# Patient Record
Sex: Female | Born: 1960 | Race: White | Hispanic: No | Marital: Married | Smoking: Never smoker
Health system: Southern US, Community
[De-identification: ages and names within clinical notes are randomized; demographics above are authoritative.]

## PROBLEM LIST (undated history)

## (undated) DIAGNOSIS — F419 Anxiety disorder, unspecified: Secondary | ICD-10-CM

## (undated) DIAGNOSIS — E079 Disorder of thyroid, unspecified: Secondary | ICD-10-CM

## (undated) DIAGNOSIS — K219 Gastro-esophageal reflux disease without esophagitis: Secondary | ICD-10-CM

## (undated) HISTORY — PX: ABLATION: SHX5711

---

## 2015-06-05 ENCOUNTER — Encounter (HOSPITAL_BASED_OUTPATIENT_CLINIC_OR_DEPARTMENT_OTHER): Payer: Self-pay | Admitting: Emergency Medicine

## 2015-06-05 ENCOUNTER — Emergency Department (HOSPITAL_BASED_OUTPATIENT_CLINIC_OR_DEPARTMENT_OTHER)
Admission: EM | Admit: 2015-06-05 | Discharge: 2015-06-05 | Disposition: A | Discharge status: Home / Self Care | Payer: 59 | Attending: Emergency Medicine | Admitting: Emergency Medicine

## 2015-06-05 DIAGNOSIS — K219 Gastro-esophageal reflux disease without esophagitis: Secondary | ICD-10-CM | POA: Insufficient documentation

## 2015-06-05 DIAGNOSIS — S61219A Laceration without foreign body of unspecified finger without damage to nail, initial encounter: Secondary | ICD-10-CM

## 2015-06-05 DIAGNOSIS — Z79899 Other long term (current) drug therapy: Secondary | ICD-10-CM | POA: Insufficient documentation

## 2015-06-05 DIAGNOSIS — S61210A Laceration without foreign body of right index finger without damage to nail, initial encounter: Secondary | ICD-10-CM | POA: Diagnosis not present

## 2015-06-05 DIAGNOSIS — Z23 Encounter for immunization: Secondary | ICD-10-CM | POA: Diagnosis not present

## 2015-06-05 DIAGNOSIS — Y9289 Other specified places as the place of occurrence of the external cause: Secondary | ICD-10-CM | POA: Insufficient documentation

## 2015-06-05 DIAGNOSIS — Y9389 Activity, other specified: Secondary | ICD-10-CM | POA: Insufficient documentation

## 2015-06-05 DIAGNOSIS — F419 Anxiety disorder, unspecified: Secondary | ICD-10-CM | POA: Insufficient documentation

## 2015-06-05 DIAGNOSIS — E079 Disorder of thyroid, unspecified: Secondary | ICD-10-CM | POA: Insufficient documentation

## 2015-06-05 DIAGNOSIS — Y998 Other external cause status: Secondary | ICD-10-CM | POA: Insufficient documentation

## 2015-06-05 DIAGNOSIS — W271XXA Contact with garden tool, initial encounter: Secondary | ICD-10-CM | POA: Diagnosis not present

## 2015-06-05 HISTORY — DX: Gastro-esophageal reflux disease without esophagitis: K21.9

## 2015-06-05 HISTORY — DX: Anxiety disorder, unspecified: F41.9

## 2015-06-05 HISTORY — DX: Disorder of thyroid, unspecified: E07.9

## 2015-06-05 MED ORDER — LIDOCAINE-EPINEPHRINE 2 %-1:100000 IJ SOLN
20.0000 mL | Freq: Once | INTRAMUSCULAR | Status: AC
  Administered 2015-06-05: 20 mL via INTRADERMAL
  Filled 2015-06-05: qty 1

## 2015-06-05 MED ORDER — TETANUS-DIPHTH-ACELL PERTUSSIS 5-2.5-18.5 LF-MCG/0.5 IM SUSP
0.5000 mL | Freq: Once | INTRAMUSCULAR | Status: AC
  Administered 2015-06-05: 0.5 mL via INTRAMUSCULAR
  Filled 2015-06-05: qty 0.5

## 2015-06-05 NOTE — ED Provider Notes (Signed)
CSN: [id number]     Arrival date & time 06/05/15  1348 History   First MD Initiated Contact with Patient 06/05/15 1452     Chief Complaint  Patient presents with  . Laceration     (Consider location/radiation/quality/duration/timing/severity/associated sxs/prior Treatment) Patient is a 54 y.o. female presenting with skin laceration.  Laceration    54 year old right handed female with a laceration to her left right digit on the palmar surface at the PIP joint and between the PIP and the DIP joint with a yard tool.  She rinsed it under running water.  No numbness or difficulty moving it.  She last had tetanus 8 years ago.  This occurred 1.5 hours pte.   Past Medical History  Diagnosis Date  . Thyroid disease   . Anxiety   . GERD (gastroesophageal reflux disease)    Past Surgical History  Procedure Laterality Date  . Cesarean section    . Ablation     History reviewed. No pertinent family history. Social History  Substance Use Topics  . Smoking status: Never Smoker   . Smokeless tobacco: None  . Alcohol Use: Yes     Comment: occ   OB History    No data available     Review of Systems  All other systems reviewed and are negative.     Allergies  Codeine  Home Medications   Prior to Admission medications   Medication Sig Start Date End Date Taking? Authorizing Provider  citalopram (CELEXA) 20 MG tablet Take 20 mg by mouth daily.   Yes Historical Provider, MD  esomeprazole (NEXIUM) 40 MG capsule Take 40 mg by mouth daily at 12 noon.   Yes Historical Provider, MD  levothyroxine (SYNTHROID, LEVOTHROID) 75 MCG tablet Take 75 mcg by mouth daily before breakfast.   Yes Historical Provider, MD   BP 191/92 mmHg  Pulse 55  Temp(Src) 98.3 F (36.8 C) (Oral)  Resp 18  Ht 5\' 2"  (1.575 m)  Wt 63.504 kg  BMI 25.60 kg/m2  SpO2 99% Physical Exam  Constitutional: She appears well-developed.  HENT:  Head: Normocephalic and atraumatic.  Musculoskeletal:        Hands: Fingertip pink cap refill <2 seconds, sensation intact  Neurological: She is alert.  Skin: Skin is warm.  Psychiatric: She has a normal mood and affect.  Nursing note and vitals reviewed.   ED Course  .Marland Kitchen.Laceration Repair Date/Time: 06/05/2015 3:27 PM Performed by: Margarita GrizzleAY, Wilho Sharpley Authorized by: Margarita GrizzleAY, Anneke Cundy Consent given by: patient Patient identity confirmed: verbally with patient Body area: upper extremity Location details: right index finger Laceration length: 2 cm Foreign bodies: no foreign bodies Tendon involvement: none Nerve involvement: none Anesthesia: local infiltration Local anesthetic: lidocaine 1% with epinephrine Anesthetic total: 2 ml Patient sedated: no Preparation: Patient was prepped and draped in the usual sterile fashion. Irrigation solution: saline Irrigation method: syringe Amount of cleaning: extensive Debridement: none Skin closure: 4-0 Prolene Number of sutures: 4 Technique: simple Approximation: loose Approximation difficulty: simple Dressing: 4x4 sterile gauze Comments: Plan dressing and splint   (including critical care time) Labs Review Labs Reviewed - No data to display  Imaging Review No results found. I have personally reviewed and evaluated these images and lab results as part of my medical decision-making.   EKG Interpretation None      MDM   Final diagnoses:  Finger laceration, initial encounter    TDap ordered.  Return precautions discussed.  Patient advised re suture removal at pmd office.  Significantly elevated  bp noted.  Will recheck and have f/u at pmd     Margarita Grizzle, MD 06/06/15 [phone]

## 2015-06-05 NOTE — Discharge Instructions (Signed)
Sutures out 7-10 days.   Laceration Care, Adult A laceration is a cut that goes through all layers of the skin. The cut also goes into the tissue that is right under the skin. Some cuts heal on their own. Others need to be closed with stitches (sutures), staples, skin adhesive strips, or wound glue. Taking care of your cut lowers your risk of infection and helps your cut to heal better. HOW TO TAKE CARE OF YOUR CUT For stitches or staples:  Keep the wound clean and dry.  If you were given a bandage (dressing), you should change it at least one time per day or as told by your doctor. You should also change it if it gets wet or dirty.  Keep the wound completely dry for the first 24 hours or as told by your doctor. After that time, you may take a shower or a bath. However, make sure that the wound is not soaked in water until after the stitches or staples have been removed.  Clean the wound one time each day or as told by your doctor:  Wash the wound with soap and water.  Rinse the wound with water until all of the soap comes off.  Pat the wound dry with a clean towel. Do not rub the wound.  After you clean the wound, put a thin layer of antibiotic ointment on it as told by your doctor. This ointment:  Helps to prevent infection.  Keeps the bandage from sticking to the wound.  Have your stitches or staples removed as told by your doctor. If your doctor used skin adhesive strips:   Keep the wound clean and dry.  If you were given a bandage, you should change it at least one time per day or as told by your doctor. You should also change it if it gets dirty or wet.  Do not get the skin adhesive strips wet. You can take a shower or a bath, but be careful to keep the wound dry.  If the wound gets wet, pat it dry with a clean towel. Do not rub the wound.  Skin adhesive strips fall off on their own. You can trim the strips as the wound heals. Do not remove any strips that are still stuck  to the wound. They will fall off after a while. If your doctor used wound glue:  Try to keep your wound dry, but you may briefly wet it in the shower or bath. Do not soak the wound in water, such as by swimming.  After you take a shower or a bath, gently pat the wound dry with a clean towel. Do not rub the wound.  Do not do any activities that will make you really sweaty until the skin glue has fallen off on its own.  Do not apply liquid, cream, or ointment medicine to your wound while the skin glue is still on.  If you were given a bandage, you should change it at least one time per day or as told by your doctor. You should also change it if it gets dirty or wet.  If a bandage is placed over the wound, do not let the tape for the bandage touch the skin glue.  Do not pick at the glue. The skin glue usually stays on for 5-10 days. Then, it falls off of the skin. General Instructions  To help prevent scarring, make sure to cover your wound with sunscreen whenever you are outside after stitches are  removed, after adhesive strips are removed, or when wound glue stays in place and the wound is healed. Make sure to wear a sunscreen of at least 30 SPF.  Take over-the-counter and prescription medicines only as told by your doctor.  If you were given antibiotic medicine or ointment, take or apply it as told by your doctor. Do not stop using the antibiotic even if your wound is getting better.  Do not scratch or pick at the wound.  Keep all follow-up visits as told by your doctor. This is important.  Check your wound every day for signs of infection. Watch for:  Redness, swelling, or pain.  Fluid, blood, or pus.  Raise (elevate) the injured area above the level of your heart while you are sitting or lying down, if possible. GET HELP IF:  You got a tetanus shot and you have any of these problems at the injection site:  Swelling.  Very bad pain.  Redness.  Bleeding.  You have a  fever.  A wound that was closed breaks open.  You notice a bad smell coming from your wound or your bandage.  You notice something coming out of the wound, such as wood or glass.  Medicine does not help your pain.  You have more redness, swelling, or pain at the site of your wound.  You have fluid, blood, or pus coming from your wound.  You notice a change in the color of your skin near your wound.  You need to change the bandage often because fluid, blood, or pus is coming from the wound.  You start to have a new rash.  You start to have numbness around the wound. GET HELP RIGHT AWAY IF:  You have very bad swelling around the wound.  Your pain suddenly gets worse and is very bad.  You notice painful lumps near the wound or on skin that is anywhere on your body.  You have a red streak going away from your wound.  The wound is on your hand or foot and you cannot move a finger or toe like you usually can.  The wound is on your hand or foot and you notice that your fingers or toes look pale or bluish.   This information is not intended to replace advice given to you by your health care provider. Make sure you discuss any questions you have with your health care provider.   Document Released: 12/06/2007 Document Revised: 11/03/2014 Document Reviewed: 06/15/2014 Elsevier Interactive Patient Education [employer]2016 [employer]

## 2015-06-05 NOTE — ED Notes (Signed)
Patient states that she cut her finger with a bush cutter

## 2018-05-14 HISTORY — PX: OTHER SURGICAL HISTORY: SHX169

## 2018-06-09 ENCOUNTER — Encounter (HOSPITAL_BASED_OUTPATIENT_CLINIC_OR_DEPARTMENT_OTHER): Payer: Self-pay

## 2018-06-09 ENCOUNTER — Other Ambulatory Visit: Payer: Self-pay

## 2018-06-09 ENCOUNTER — Emergency Department (HOSPITAL_BASED_OUTPATIENT_CLINIC_OR_DEPARTMENT_OTHER)
Admission: EM | Admit: 2018-06-09 | Discharge: 2018-06-10 | Disposition: A | Discharge status: Home / Self Care | Payer: 59 | Attending: Emergency Medicine | Admitting: Emergency Medicine

## 2018-06-09 DIAGNOSIS — H5713 Ocular pain, bilateral: Secondary | ICD-10-CM | POA: Diagnosis present

## 2018-06-09 DIAGNOSIS — F419 Anxiety disorder, unspecified: Secondary | ICD-10-CM | POA: Diagnosis not present

## 2018-06-09 DIAGNOSIS — Z7982 Long term (current) use of aspirin: Secondary | ICD-10-CM | POA: Insufficient documentation

## 2018-06-09 DIAGNOSIS — Z79899 Other long term (current) drug therapy: Secondary | ICD-10-CM | POA: Diagnosis not present

## 2018-06-09 DIAGNOSIS — H1033 Unspecified acute conjunctivitis, bilateral: Secondary | ICD-10-CM

## 2018-06-09 DIAGNOSIS — H1089 Other conjunctivitis: Secondary | ICD-10-CM | POA: Diagnosis not present

## 2018-06-09 NOTE — ED Triage Notes (Signed)
Pt has swollen eye with drainage L>R. Started last PM.

## 2018-06-10 DIAGNOSIS — H1089 Other conjunctivitis: Secondary | ICD-10-CM | POA: Diagnosis not present

## 2018-06-10 MED ORDER — FLUORESCEIN SODIUM 1 MG OP STRP
1.0000 | ORAL_STRIP | Freq: Once | OPHTHALMIC | Status: AC
  Administered 2018-06-10: 1 via OPHTHALMIC
  Filled 2018-06-10: qty 1

## 2018-06-10 MED ORDER — MOXIFLOXACIN HCL 0.5 % OP SOLN: 1.0000 [drp] | OPHTHALMIC | 0 refills | Status: AC

## 2018-06-10 MED ORDER — TETRACAINE HCL 0.5 % OP SOLN
1.0000 [drp] | Freq: Once | OPHTHALMIC | Status: AC
  Administered 2018-06-10: 1 [drp] via OPHTHALMIC
  Filled 2018-06-10: qty 4

## 2018-06-10 NOTE — Discharge Instructions (Addendum)
You were seen today with eye drainage and redness.  Your symptoms are most suggestive of bacterial conjunctivitis.  It is unclear whether you have a developing corneal ulcer.  It is very important that you take eyedrops as directed.  Call your eye doctor first thing in the morning to be evaluated immediately.  If you develop worsening pain, redness, swelling, new vision changes, you need to be reevaluated immediately.

## 2018-06-10 NOTE — ED Notes (Signed)
ED Provider at bedside. 

## 2018-06-10 NOTE — ED Provider Notes (Signed)
[facility] EMERGENCY DEPARTMENT Provider Note   CSN: [id number] Arrival date & time: 06/09/18  2328     History   Chief Complaint Chief Complaint  Patient presents with  . Eye Drainage    HPI Rachel Ware is a 57 y.o. female.  HPI  This 57 year old female who presents with bilateral eye pain and drainage.  Patient reports that she went to a party last night and wore eye make-up.  She does wear contacts.  She noted pain and swelling to the left eye during the party.  She came home and removed her contacts and washed her face.  Since that time she has developed copious drainage which is been yellow from the left eye.  She subsequently developed drainage and redness of the right eye.  She denies any vision changes.  She is not currently wearing her contacts.  She denies any fevers.  Tonight she noted some swelling of the left eye.  She does report recent upper respiratory symptoms.  She denies any allergic symptoms.  No fevers.  Past Medical History:  Diagnosis Date  . Anxiety   . GERD (gastroesophageal reflux disease)   . Thyroid disease     There are no active problems to display for this patient.   Past Surgical History:  Procedure Laterality Date  . ABLATION    . carpel tunnel Right 05/14/2018  . CESAREAN SECTION       OB History   None      Home Medications    Prior to Admission medications   Medication Sig Start Date End Date Taking? Authorizing Provider  aspirin 81 MG tablet Take 81 mg by mouth daily.   Yes [provider]  citalopram (CELEXA) 20 MG tablet Take 20 mg by mouth daily.   Yes [provider]  esomeprazole (NEXIUM) 40 MG capsule Take 40 mg by mouth daily at 12 noon.   Yes [provider]  hydrochlorothiazide (HYDRODIURIL) 25 MG tablet Take 25 mg by mouth daily.   Yes [provider]  levothyroxine (SYNTHROID, LEVOTHROID) 75 MCG tablet Take 75 mcg by mouth daily before breakfast.   Yes [provider]  losartan (COZAAR) 25 MG tablet Take 25 mg by mouth daily.   Yes [provider]  Ospemifene (OSPHENA) 60 MG TABS Take by mouth.   Yes [provider]  moxifloxacin (VIGAMOX) 0.5 % ophthalmic solution Place 1 drop into both eyes every hour while awake. 06/10/18   Maheen Cwikla, Mayer Maskerourtney F, MD    Family History No family history on file.  Social History Social History   Tobacco Use  . Smoking status: Never Smoker  . Smokeless tobacco: Never Used  Substance Use Topics  . Alcohol use: Yes    Comment: occ  . Drug use: No     Allergies   Codeine   Review of Systems Review of Systems  Constitutional: Negative for fever.  Eyes: Positive for photophobia, pain, discharge and redness. Negative for itching and visual disturbance.  All other systems reviewed and are negative.    Physical Exam Updated Vital Signs BP (!) 152/92   Pulse 68   Temp 98.9 F (37.2 C) (Oral)   Resp 18   Ht 1.575 m (5\' 2" )   Wt 68 kg   SpO2 94%   BMI 27.44 kg/m   Physical Exam  Constitutional: She is oriented to person, place, and time. She appears well-developed and well-nourished. No distress.  HENT:  Head: Normocephalic and atraumatic.  Eyes: Pupils are equal, round, and reactive to light.  Pupils 4 mm and reactive bilaterally, bilateral conjunctival injection left greater than right, significant scleral redness left eye with copious purulent drainage bilaterally, slight swelling of the left eyelid, extraocular movements intact Fluoroscein exam: No corneal uptake, no obvious corneal ulcer  Cardiovascular: Normal rate and regular rhythm.  Pulmonary/Chest: Effort normal. No respiratory distress.  Neurological: She is alert and oriented to person, place, and time.  Skin: Skin is warm and dry.  Psychiatric: She has a normal mood and affect.  Nursing note and vitals reviewed.    ED Treatments / Results  Labs (all labs ordered are listed, but only abnormal results are  displayed) Labs Reviewed  EYE CULTURE    EKG None  Radiology No results found.  Procedures Procedures (including critical care time)  Medications Ordered in ED Medications  tetracaine (PONTOCAINE) 0.5 % ophthalmic solution 1 drop (1 drop Both Eyes Given by Other 06/10/18 0012)  fluorescein ophthalmic strip 1 strip (1 strip Both Eyes Given by Other 06/10/18 0012)     Initial Impression / Assessment and Plan / ED Course  I have reviewed the triage vital signs and the nursing notes.  Pertinent labs & imaging results that were available during my care of the patient were reviewed by me and considered in my medical decision making (see chart for details).     Patient presents with acute eye redness and drainage.  She is overall nontoxic-appearing vital signs are reassuring.  Physical exam is most consistent with acute bacterial conjunctivitis bilaterally.  There is no obvious corneal ulcer on exam; however, patient is obviously at risk given that she wears contacts and the progression of symptoms has been fairly acute.  She also has had a recent upper respiratory infection but feel viral is less likely.  She does have some eyelid swelling but at this time feel that this is likely related to the conjunctivitis and does not represent septal or preseptal cellulitis.  Extraocular movements are intact.  I stressed to the patient that she needs to have a formal eye exam urgently.  She does have an eye doctor.  She needs to avoid wearing contacts and needs to throw her contacts away as well as the make-up that she wore.  I did send a culture of the drainage.  We will start her on Vigamox drops every hour to cover for possible Pseudomonas.  Advised the patient that if she has any new or worsening symptoms until she can follow-up with her eye doctor later this morning, she needs to be reevaluated immediately.  Patient stated understanding.  After history, exam, and medical workup I feel the patient has  been appropriately medically screened and is safe for discharge home. Pertinent diagnoses were discussed with the patient. Patient was given return precautions.   Final Clinical Impressions(s) / ED Diagnoses   Final diagnoses:  Acute bacterial conjunctivitis of both eyes    ED Discharge Orders         Ordered    moxifloxacin (VIGAMOX) 0.5 % ophthalmic solution  Every hour while awake     06/10/18 0045           Shon Baton, MD 06/10/18 [phone]

## 2018-06-10 NOTE — ED Notes (Signed)
Patient left at this time with all belongings. 

## 2018-06-12 LAB — EYE CULTURE

## 2018-06-13 ENCOUNTER — Telehealth: Payer: Self-pay | Admitting: *Deleted

## 2018-06-13 NOTE — Telephone Encounter (Signed)
Post ED Visit - Positive Culture Follow-up  Culture report reviewed by antimicrobial stewardship pharmacist:  []  Enzo BiNathan Batchelder, Pharm.D. []  Celedonio MiyamotoJeremy Frens, Pharm.D., BCPS AQ-ID []  Garvin FilaMike Maccia, Pharm.D., BCPS []  Georgina PillionElizabeth Martin, Pharm.D., BCPS []  Ty TyMinh Pham, [address] BoulevardPharm.D., BCPS, AAHIVP []  Estella HuskMichelle Turner, Pharm.D., BCPS, AAHIVP []  Lysle Pearlachel Rumbarger, PharmD, BCPS []  Phillips Climeshuy Dang, PharmD, BCPS []  Agapito GamesAlison Masters, PharmD, BCPS []  Verlan FriendsErin Deja, PharmD  Positive eye culture Treated with Moxifloxacin eye gtts, organism sensitive to the same and no further patient follow-up is required at this time.  Virl AxeRobertson, Arvilla Salada [facility] 06/13/2018, 12:27 PM

## 2021-01-26 ENCOUNTER — Emergency Department (HOSPITAL_BASED_OUTPATIENT_CLINIC_OR_DEPARTMENT_OTHER): Payer: 59

## 2021-01-26 ENCOUNTER — Emergency Department (HOSPITAL_BASED_OUTPATIENT_CLINIC_OR_DEPARTMENT_OTHER)
Admission: EM | Admit: 2021-01-26 | Discharge: 2021-01-26 | Disposition: A | Discharge status: Home / Self Care | Payer: 59 | Attending: Emergency Medicine | Admitting: Emergency Medicine

## 2021-01-26 ENCOUNTER — Encounter (HOSPITAL_BASED_OUTPATIENT_CLINIC_OR_DEPARTMENT_OTHER): Payer: Self-pay | Admitting: *Deleted

## 2021-01-26 ENCOUNTER — Other Ambulatory Visit: Payer: Self-pay

## 2021-01-26 DIAGNOSIS — W19XXXA Unspecified fall, initial encounter: Secondary | ICD-10-CM | POA: Insufficient documentation

## 2021-01-26 DIAGNOSIS — R600 Localized edema: Secondary | ICD-10-CM | POA: Diagnosis not present

## 2021-01-26 DIAGNOSIS — S6992XA Unspecified injury of left wrist, hand and finger(s), initial encounter: Secondary | ICD-10-CM | POA: Insufficient documentation

## 2021-01-26 DIAGNOSIS — S300XXA Contusion of lower back and pelvis, initial encounter: Secondary | ICD-10-CM | POA: Insufficient documentation

## 2021-01-26 DIAGNOSIS — T148XXA Other injury of unspecified body region, initial encounter: Secondary | ICD-10-CM

## 2021-01-26 DIAGNOSIS — Z7982 Long term (current) use of aspirin: Secondary | ICD-10-CM | POA: Diagnosis not present

## 2021-01-26 DIAGNOSIS — S3993XA Unspecified injury of pelvis, initial encounter: Secondary | ICD-10-CM | POA: Diagnosis present

## 2021-01-26 LAB — CBC
HCT: 38.5 % (ref 36.0–46.0)
Hemoglobin: 13 g/dL (ref 12.0–15.0)
MCH: 29 pg (ref 26.0–34.0)
MCHC: 33.8 g/dL (ref 30.0–36.0)
MCV: 85.7 fL (ref 80.0–100.0)
Platelets: 369 10*3/uL (ref 150–400)
RBC: 4.49 MIL/uL (ref 3.87–5.11)
RDW: 12.3 % (ref 11.5–15.5)
WBC: 8.6 10*3/uL (ref 4.0–10.5)
nRBC: 0 % (ref 0.0–0.2)

## 2021-01-26 MED ORDER — KETOROLAC TROMETHAMINE 15 MG/ML IJ SOLN
15.0000 mg | Freq: Once | INTRAMUSCULAR | Status: AC
  Administered 2021-01-26: 15 mg via INTRAMUSCULAR
  Filled 2021-01-26: qty 1

## 2021-01-26 NOTE — ED Notes (Signed)
ED Provider at bedside. 

## 2021-01-26 NOTE — Discharge Instructions (Addendum)
Continue icing and taking scheduled anti-inflammatories for the bruise to your right buttocks.  You can also make sure that you are staying sitting on a inflatable ring to help keep pressure off of this area.  Your hemoglobin is normal today.  No signs of significant blood loss.  I would make sure that you are following up with your primary care doctor as you may need further referral if the swelling does not improve.  In terms of your left pinky finger there is no evidence of a fracture.  This is likely a sprain.  Use the splint at all times to help it heal you can also follow-up with your hand doctor next week.

## 2021-01-26 NOTE — ED Provider Notes (Signed)
[facility] EMERGENCY DEPARTMENT Provider Note   CSN: [id number] Arrival date & time: 01/26/21  1300     History Chief Complaint  Patient presents with   Rachel Ware is a 60 y.o. female.  HPI 60 year old female presents the ER for evaluation of right buttocks pain after a fall 5 days ago.  Patient also complains of right fifth finger pain after she fell a dislocated her finger several days ago.  Patient reports hematoma to her right buttocks that continues to spread.  She has difficulties with sitting secondary to the pain.  Denies any loss of bowel or bladder, saddle paresthesia or urinary retention.  As she has been taking intermittent ibuprofen for the pain.  No significant relief with this.  Patient was concerned because the bruising seems to be spreading.  Patient denies any head injury.  Patient reports that she also dislocated her right fifth digit but she relocated this after the injury.  Patient reports that it continues to swell and has difficulties with full range of motion.    Past Medical History:  Diagnosis Date   Anxiety    GERD (gastroesophageal reflux disease)    Thyroid disease     There are no problems to display for this patient.   Past Surgical History:  Procedure Laterality Date   ABLATION     carpel tunnel Right 05/14/2018   CESAREAN SECTION       OB History   No obstetric history on file.     No family history on file.  Social History   Tobacco Use   Smoking status: Never   Smokeless tobacco: Never  Vaping Use   Vaping Use: Never used  Substance Use Topics   Alcohol use: Yes    Comment: occ   Drug use: No    Home Medications Prior to Admission medications   Medication Sig Start Date End Date Taking? Authorizing Provider  aspirin 81 MG tablet Take 81 mg by mouth daily.    [provider]  citalopram (CELEXA) 20 MG tablet Take 20 mg by mouth daily.    [provider]  esomeprazole (NEXIUM)  40 MG capsule Take 40 mg by mouth daily at 12 noon.    [provider]  hydrochlorothiazide (HYDRODIURIL) 25 MG tablet Take 25 mg by mouth daily.    [provider]  levothyroxine (SYNTHROID, LEVOTHROID) 75 MCG tablet Take 75 mcg by mouth daily before breakfast.    [provider]  losartan (COZAAR) 25 MG tablet Take 25 mg by mouth daily.    [provider]  moxifloxacin (VIGAMOX) 0.5 % ophthalmic solution Place 1 drop into both eyes every hour while awake. 06/10/18   Horton, Mayer Masker, MD  Ospemifene (OSPHENA) 60 MG TABS Take by mouth.    [provider]    Allergies    Codeine  Review of Systems   Review of Systems  Constitutional:  Negative for chills and fever.  HENT:  Negative for congestion.   Eyes:  Negative for discharge.  Respiratory:  Negative for cough.   Gastrointestinal:  Negative for abdominal pain and vomiting.  Genitourinary:  Negative for difficulty urinating.  Musculoskeletal:  Positive for arthralgias, joint swelling and myalgias.  Skin:  Positive for color change.  Neurological:  Negative for weakness and numbness.  Psychiatric/Behavioral:  Negative for confusion.    Physical Exam Updated Vital Signs BP (!) 161/83 (BP Location: Right Arm)   Pulse 70   Temp  98.4 F (36.9 C) (Oral)   Resp 16   Ht 5\' 2"  (1.575 m)   Wt 68 kg   SpO2 99%   BMI 27.44 kg/m   Physical Exam Vitals and nursing note reviewed.  Constitutional:      General: She is not in acute distress.    Appearance: She is well-developed. She is not ill-appearing or toxic-appearing.  HENT:     Head: Normocephalic and atraumatic.  Eyes:     General: No scleral icterus.       Right eye: No discharge.        Left eye: No discharge.  Pulmonary:     Effort: No respiratory distress.  Musculoskeletal:        General: Normal range of motion.     Cervical back: Normal range of motion.     Comments: Patient with edema over the left pinky finger.  There  is no obvious deformity.  She does have limited range of motion secondary to the swelling.  Cap refill is normal.  Sensation is intact.  Skin:    General: Skin is warm and dry.     Coloration: Skin is not pale.     Comments: Patient has large hematoma to the right buttocks with small amount of spread to the left buttocks.  The area is indurated.  There is no erythema or warmth.  Patient has no fluctuance.  Neurological:     Mental Status: She is alert.     Gait: Gait normal.  Psychiatric:        Mood and Affect: Mood normal.        Behavior: Behavior normal.        Thought Content: Thought content normal.        Judgment: Judgment normal.    ED Results / Procedures / Treatments   Labs (all labs ordered are listed, but only abnormal results are displayed) Labs Reviewed  CBC    EKG None  Radiology DG Lumbar Spine Complete  Result Date: 01/26/2021 CLINICAL DATA:  Fall.  Back pain EXAM: LUMBAR SPINE - COMPLETE 4+ VIEW COMPARISON:  None. FINDINGS: There is no evidence of lumbar spine fracture. Alignment is normal. Intervertebral disc spaces are maintained. IMPRESSION: Negative. Electronically Signed   By: 07-29-1968 M.D.   On: 01/26/2021 14:02   DG Finger Little Left  Result Date: 01/26/2021 CLINICAL DATA:  Injury fifth finger EXAM: LEFT LITTLE FINGER 2+V COMPARISON:  None. FINDINGS: There is no evidence of fracture or dislocation. There is no evidence of arthropathy or other focal bone abnormality. Soft tissues are unremarkable. IMPRESSION: Negative. Electronically Signed   By: 01/28/2021 M.D.   On: 01/26/2021 14:01    Procedures Procedures   Medications Ordered in ED Medications  ketorolac (TORADOL) 15 MG/ML injection 15 mg (15 mg Intramuscular Given 01/26/21 1436)    ED Course  I have reviewed the triage vital signs and the nursing notes.  Pertinent labs & imaging results that were available during my care of the patient were reviewed by me and considered in my  medical decision making (see chart for details).    MDM Rules/Calculators/A&P                           60 year old presents with 2 separate complaints.  She states that she had a fall 5 days ago onto her right buttocks.  Patient does have large hematoma.  She reports that it is tender  to sit and has had some spread of the ecchymosis.  On exam patient does have large hematoma appreciated.  There is no area of fluctuance, erythema or warmth to suspect secondary infection at this time.  X-ray was performed and reviewed that shows no evidence of fracture or malalignment.  Patient also had a CBC performed that shows hemoglobin is normal and at baseline there is no evidence of acute blood loss.  Patient to continue icing and elevating the area.  I also discussed use of a donut ring and continuing scheduled anti-inflammatories.  Need close outpatient follow-up.  Patient also has pain to her left pinky finger after dislocating this from a fall several days ago as well.  X-ray reviewed today shows no evidence of fracture or malalignment.  Patient placed into a splint for comfort and to improve healing.  Patient encouraged rice therapy and does have outpatient follow-up with hand orthopedics  Pt is hemodynamically stable, in NAD, & able to ambulate in the ED. Evaluation does not show pathology that would require ongoing emergent intervention or inpatient treatment. I explained the diagnosis to the patient. Pain has been managed & has no complaints prior to dc. Pt is comfortable with above plan and is stable for discharge at this time. All questions were answered prior to disposition. Strict return precautions for f/u to the ED were discussed. Encouraged follow up with PCP.  Pt seen by my attending Dr. Delford Field who agrees with the above plan.  Final Clinical Impression(s) / ED Diagnoses Final diagnoses:  Fall, initial encounter  Injury of finger of left hand, initial encounter  Hematoma    Rx / DC Orders ED  Discharge Orders     None        Wallace Keller 01/26/21 1513    Koleen Distance, MD 01/26/21 [phone]

## 2021-01-26 NOTE — ED Triage Notes (Addendum)
Fall x 5 days c.o right buttocks pain , c/o left 5th finger pain

## 2021-12-12 IMAGING — CR DG FINGER LITTLE 2+V*L*
3 series · 3 of 3 positions shown · non-contrast
Comparison: None.

CLINICAL DATA: Injury fifth finger

EXAM:
LEFT LITTLE FINGER 2+V

[x finger pa left]
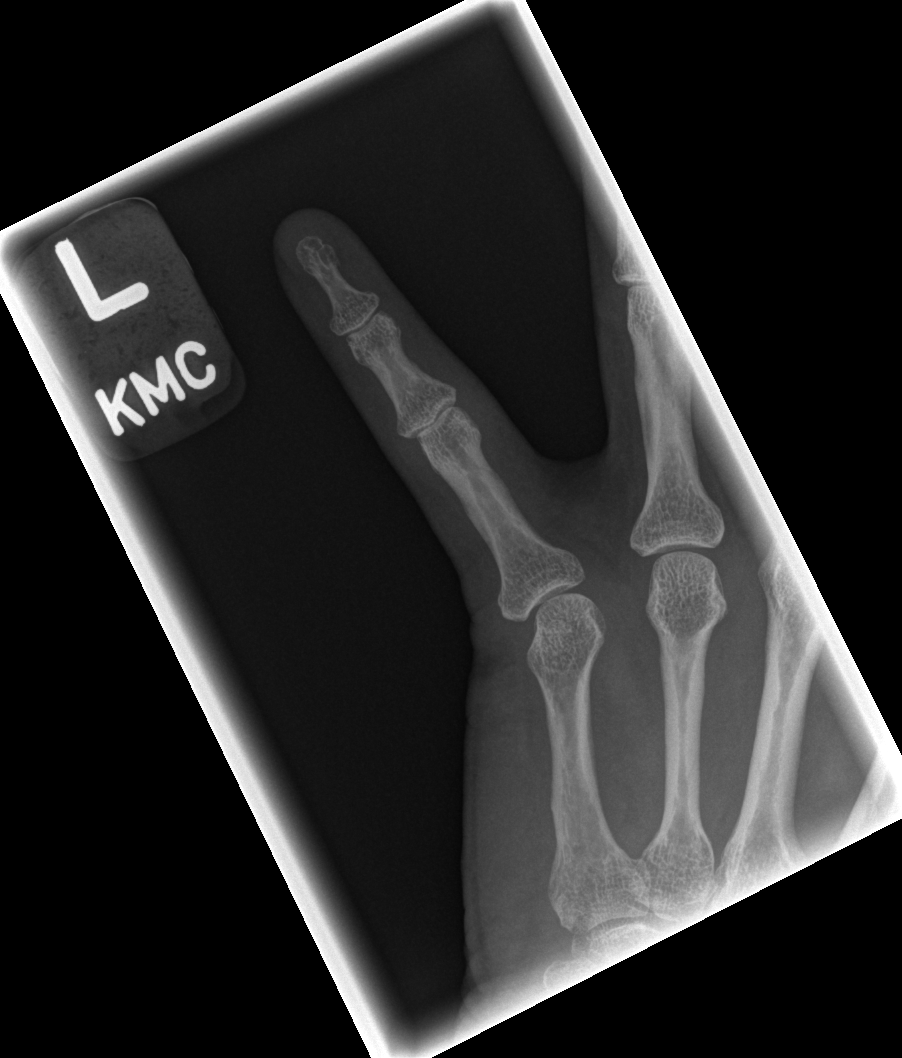

[x finger obl. left]
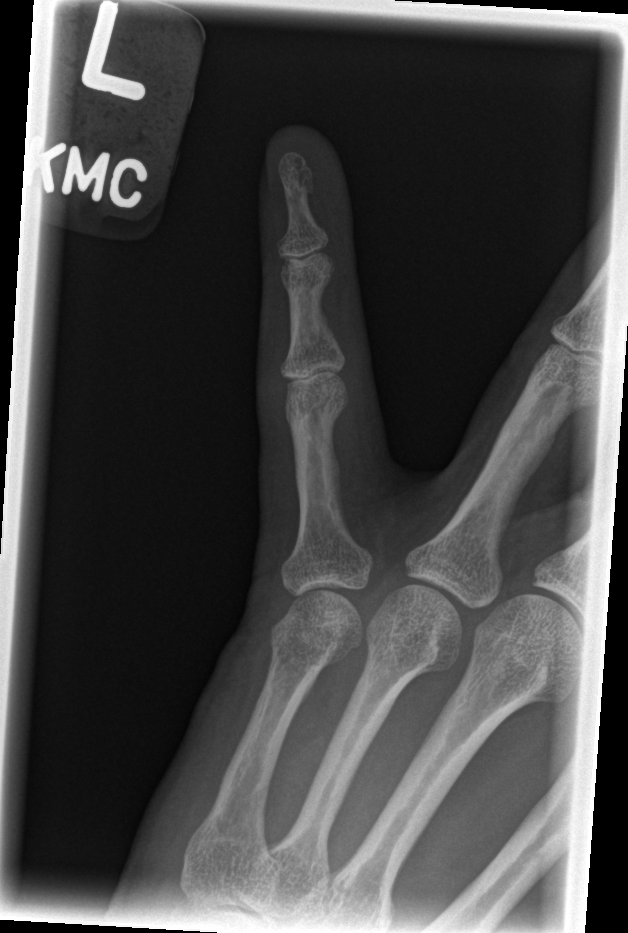

[x finger lateral left]
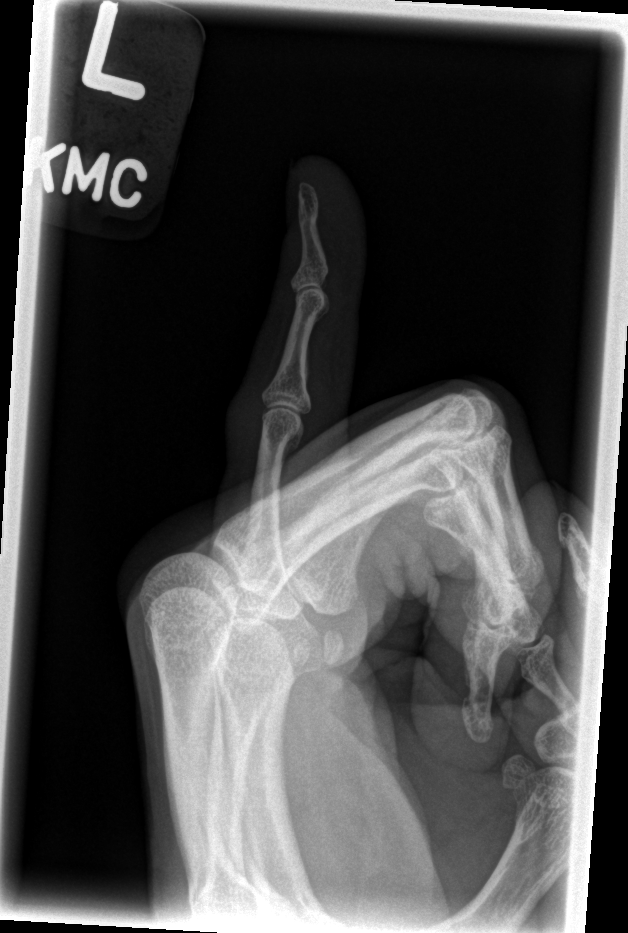

[3 of 3 positions shown; findings below may reference images not displayed]

FINDINGS: There is no evidence of fracture or dislocation. There is no
evidence of arthropathy or other focal bone abnormality. Soft
tissues are unremarkable.
IMPRESSION: Negative.

## 2022-07-13 ENCOUNTER — Encounter (HOSPITAL_BASED_OUTPATIENT_CLINIC_OR_DEPARTMENT_OTHER): Payer: Self-pay | Admitting: Pediatrics

## 2022-07-13 ENCOUNTER — Other Ambulatory Visit: Payer: Self-pay

## 2022-07-13 ENCOUNTER — Emergency Department (HOSPITAL_BASED_OUTPATIENT_CLINIC_OR_DEPARTMENT_OTHER)
Admission: EM | Admit: 2022-07-13 | Discharge: 2022-07-13 | Disposition: A | Discharge status: Home / Self Care | Attending: Emergency Medicine | Admitting: Emergency Medicine

## 2022-07-13 DIAGNOSIS — J3489 Other specified disorders of nose and nasal sinuses: Secondary | ICD-10-CM | POA: Diagnosis not present

## 2022-07-13 DIAGNOSIS — I10 Essential (primary) hypertension: Secondary | ICD-10-CM | POA: Insufficient documentation

## 2022-07-13 DIAGNOSIS — Z79899 Other long term (current) drug therapy: Secondary | ICD-10-CM | POA: Insufficient documentation

## 2022-07-13 DIAGNOSIS — J0140 Acute pansinusitis, unspecified: Secondary | ICD-10-CM | POA: Insufficient documentation

## 2022-07-13 DIAGNOSIS — Z7982 Long term (current) use of aspirin: Secondary | ICD-10-CM | POA: Insufficient documentation

## 2022-07-13 DIAGNOSIS — R059 Cough, unspecified: Secondary | ICD-10-CM | POA: Diagnosis present

## 2022-07-13 MED ORDER — PROMETHAZINE HCL 25 MG PO TABS: 25.0000 mg | ORAL_TABLET | Freq: Two times a day (BID) | ORAL | 0 refills | Status: AC | PRN

## 2022-07-13 MED ORDER — AMOXICILLIN-POT CLAVULANATE 875-125 MG PO TABS: 1.0000 | ORAL_TABLET | Freq: Two times a day (BID) | ORAL | 0 refills | Status: AC

## 2022-07-13 NOTE — Discharge Instructions (Signed)
Thank you for letting us take care of you today.  We will treat you for bacterial sinusitis with an antibiotic. As Phenergan has helped with your cough in the past, I have prescribed this to take as needed twice daily. Please stop taking the over the counter cough and cold medications due to your elevated blood pressure.  If you do not start noticing improvement in the next few days or develop chest pain, shortness of breath, frequent vomiting or diarrhea, fever greater than 103 F or uncontrolled with Tylenol or Motrin at home, or other concerns, please be re-evaluated at nearest emergency department. Please follow up with your PCP in the next week if you are not improving.

## 2022-07-13 NOTE — ED Triage Notes (Signed)
C/O URI symptoms along with losing voice, fevers at night, since 1/1, reports takign OTC meds with some relief.

## 2022-07-13 NOTE — ED Provider Notes (Signed)
[facility] EMERGENCY DEPARTMENT Provider Note   CSN: [id number] Arrival date & time: 07/13/22  1100     History  Chief Complaint  Patient presents with   Hoarse    Rachel Ware is a 62 y.o. female with PMH HTN, anxiety, GERD, thyroid disease who presents to the ED complaining of hoarseness, cough, congestion, and facial pain for the last 11 days.  Patient has been attempting to manage symptoms with over-the-counter medications without any significant relief. She suspects these medications are raising her blood pressure as it is normally well controlled. She has a history of recurrent sinusitis estimating that she has sinusitis approximately twice a year that requires antibiotics before we will clear.  Normally, she only has the facial pain and some congestion so she did not suspect this was a sinus infection as it did not present typically for her.  She has had intermittent fevers up to 101 Fahrenheit but the last was approximately 2 days ago.  She denies chest pain, shortness of breath, nausea, vomiting, abdominal pain, ear pain, sore throat, or difficulty swallowing.  No known sick contacts.  She has no history of lung disease.     Home Medications Prior to Admission medications   Medication Sig Start Date End Date Taking? Authorizing Provider  amoxicillin-clavulanate (AUGMENTIN) 875-125 MG tablet Take 1 tablet by mouth every 12 (twelve) hours for 10 days. 07/13/22 07/23/22 Yes Neera Teng L, PA-C  promethazine (PHENERGAN) 25 MG tablet Take 1 tablet (25 mg total) by mouth 2 (two) times daily as needed for up to 5 days for nausea or vomiting. 07/13/22 07/18/22 Yes Mounir Skipper L, PA-C  aspirin 81 MG tablet Take 81 mg by mouth daily.    [provider]  citalopram (CELEXA) 20 MG tablet Take 20 mg by mouth daily.    [provider]  esomeprazole (NEXIUM) 40 MG capsule Take 40 mg by mouth daily at 12 noon.    [provider]  hydrochlorothiazide  (HYDRODIURIL) 25 MG tablet Take 25 mg by mouth daily.    [provider]  levothyroxine (SYNTHROID, LEVOTHROID) 75 MCG tablet Take 75 mcg by mouth daily before breakfast.    [provider]  losartan (COZAAR) 25 MG tablet Take 25 mg by mouth daily.    [provider]  moxifloxacin (VIGAMOX) 0.5 % ophthalmic solution Place 1 drop into both eyes every hour while awake. 06/10/18   Horton, Barbette Hair, MD  Ospemifene (OSPHENA) 60 MG TABS Take by mouth.    [provider]      Allergies    Codeine    Review of Systems   Review of Systems  Constitutional:  Positive for fever. Negative for activity change, appetite change and chills.  HENT:  Positive for congestion, rhinorrhea and sinus pain. Negative for ear pain, sore throat, trouble swallowing and voice change.   Eyes:  Negative for pain and visual disturbance.  Respiratory:  Positive for cough. Negative for chest tightness, shortness of breath and wheezing.   Cardiovascular:  Negative for chest pain and palpitations.  Gastrointestinal:  Negative for abdominal pain, diarrhea, nausea and vomiting.  Genitourinary:  Negative for dysuria and hematuria.  Musculoskeletal:  Negative for arthralgias, back pain, myalgias, neck pain and neck stiffness.  Skin:  Negative for color change and rash.  Neurological:  Negative for dizziness, seizures, syncope, weakness and light-headedness.  Psychiatric/Behavioral:  Negative for confusion.   All other systems reviewed and are negative.   Physical Exam Updated Vital  Signs BP (!) 177/97   Pulse 63   Temp 98.1 F (36.7 C) (Oral)   Resp 18   Ht 5\' 2"  (1.575 m)   Wt 68 kg   SpO2 100%   BMI 27.44 kg/m  Physical Exam Vitals and nursing note reviewed.  Constitutional:      General: She is not in acute distress.    Appearance: Normal appearance. She is not ill-appearing or toxic-appearing.  HENT:     Head: Normocephalic and atraumatic.     Nose: Rhinorrhea (mild clear  bilateral) present.     Right Sinus: Maxillary sinus tenderness and frontal sinus tenderness present.     Left Sinus: Maxillary sinus tenderness and frontal sinus tenderness present.     Comments: No skin changes overlying sinuses    Mouth/Throat:     Mouth: Mucous membranes are moist.     Pharynx: Oropharynx is clear. No oropharyngeal exudate or posterior oropharyngeal erythema.     Comments: Voice hoarseness, airway patent, uvula midline and without swelling, no erythema or swelling to pharynx, no signs of peritonsillar abscess Eyes:     General: No scleral icterus.       Right eye: No discharge.        Left eye: No discharge.     Conjunctiva/sclera: Conjunctivae normal.  Cardiovascular:     Rate and Rhythm: Normal rate and regular rhythm.     Heart sounds: No murmur heard. Pulmonary:     Effort: Pulmonary effort is normal. No respiratory distress.     Breath sounds: Normal breath sounds. No wheezing, rhonchi or rales.  Abdominal:     General: Abdomen is flat.     Palpations: Abdomen is soft.  Musculoskeletal:        General: Normal range of motion.     Cervical back: Normal range of motion and neck supple. No rigidity.     Right lower leg: No edema.     Left lower leg: No edema.  Skin:    General: Skin is warm and dry.     Capillary Refill: Capillary refill takes less than 2 seconds.  Neurological:     Mental Status: She is alert. Mental status is at baseline.  Psychiatric:        Mood and Affect: Mood normal.        Behavior: Behavior normal.     ED Results / Procedures / Treatments   Labs (all labs ordered are listed, but only abnormal results are displayed) Labs Reviewed - No data to display  EKG None  Radiology No results found.  Procedures None  Medications Ordered in ED Medications - No data to display  ED Course/ Medical Decision Making/ A&P                           Medical Decision Making Risk Prescription drug management.   This is a  62 year old female presenting to the ED with upper respiratory symptoms for the last 11 days in addition to voice hoarseness and sinus pressure.  Patient has a history of acute sinusitis occurring approximately twice a year requiring antibiotics to clear.  She has had intermittent fevers and has had no relief with use of over-the-counter medications for the last 11 days.  She has no chest pain, shortness of breath, sore throat, or difficulty swallowing.  On my exam, her airway is patent with no erythema or swelling to the pharynx, lungs are clear to auscultation, she has  obvious voice hoarseness and tenderness overlying all sinuses.  No overlying skin changes.  She has had negative at-home COVID tests.  She has no known sick contacts.  Discussed with patient possibility of ordering further lab work including viral swabs, blood work and/or chest x-ray.  Patient states that she really believes that she does have acute sinusitis that is not improving with over-the-counter medications and will likely require antibiotics.  With history of symptoms and my exam, believe is reasonable to treat her with outpatient antibiotics to treat acute sinusitis presenting with further labs or radiology as it is unlikely to change disposition.  Very low suspicion for pneumonia and she is having no shortness of breath and satting well on room air with normal respirations. Through shared decision making, patient will forego further evaluation at this time and will opt to be discharged home on Augmentin to treat acute bacterial sinusitis as well as Phenergan to help manage her COVID.  She has tried Tessalon in the past with no relief of her cough as well as several over-the-counter medications that have not helped.  Phenergan has helped in the past so we will give a short course of this.  Patient given strict return precautions for chest pain, shortness of breath, nausea, vomiting, abdominal pain, high fevers, or other concerns.  She  agrees to strictly follow up with primary care and/or return to the emergency department should she not improve or develop new or worsening symptoms.  All questions answered.  Stable at time of discharge.         Final Clinical Impression(s) / ED Diagnoses Final diagnoses:  Acute non-recurrent pansinusitis    Rx / DC Orders ED Discharge Orders          Ordered    amoxicillin-clavulanate (AUGMENTIN) 875-125 MG tablet  Every 12 hours        07/13/22 1155    promethazine (PHENERGAN) 25 MG tablet  2 times daily PRN        07/13/22 1155              Suzzette Righter, PA-C 07/13/22 1947    Elnora Morrison, MD 07/18/22 [phone]
